# Patient Record
Sex: Female | Born: 1960 | Race: White | Hispanic: No | Marital: Married | Smoking: Never smoker
Health system: Southern US, Community
[De-identification: ages and names within clinical notes are randomized; demographics above are authoritative.]

---

## 2001-04-20 ENCOUNTER — Other Ambulatory Visit: Admission: RE | Admit: 2001-04-20 | Discharge: 2001-04-20 | Payer: Self-pay | Admitting: Gynecology

## 2001-09-06 ENCOUNTER — Encounter: Payer: Self-pay | Admitting: Gynecology

## 2001-09-06 ENCOUNTER — Encounter: Admission: RE | Admit: 2001-09-06 | Discharge: 2001-09-06 | Payer: Self-pay | Admitting: Gynecology

## 2002-04-28 ENCOUNTER — Other Ambulatory Visit: Admission: RE | Admit: 2002-04-28 | Discharge: 2002-04-28 | Payer: Self-pay | Admitting: Gynecology

## 2002-10-12 ENCOUNTER — Encounter: Payer: Self-pay | Admitting: Gynecology

## 2002-10-12 ENCOUNTER — Encounter: Admission: RE | Admit: 2002-10-12 | Discharge: 2002-10-12 | Payer: Self-pay | Admitting: Gynecology

## 2003-05-02 ENCOUNTER — Other Ambulatory Visit: Admission: RE | Admit: 2003-05-02 | Discharge: 2003-05-02 | Payer: Self-pay | Admitting: Gynecology

## 2004-01-26 ENCOUNTER — Encounter: Admission: RE | Admit: 2004-01-26 | Discharge: 2004-01-26 | Payer: Self-pay | Admitting: Gynecology

## 2004-09-17 ENCOUNTER — Other Ambulatory Visit: Admission: RE | Admit: 2004-09-17 | Discharge: 2004-09-17 | Payer: Self-pay | Admitting: Gynecology

## 2005-08-19 ENCOUNTER — Encounter: Admission: RE | Admit: 2005-08-19 | Discharge: 2005-08-19 | Payer: Self-pay | Admitting: Gynecology

## 2005-09-08 ENCOUNTER — Encounter: Admission: RE | Admit: 2005-09-08 | Discharge: 2005-09-08 | Payer: Self-pay | Admitting: Gynecology

## 2005-09-22 ENCOUNTER — Other Ambulatory Visit: Admission: RE | Admit: 2005-09-22 | Discharge: 2005-09-22 | Payer: Self-pay | Admitting: Gynecology

## 2006-09-11 ENCOUNTER — Encounter: Admission: RE | Admit: 2006-09-11 | Discharge: 2006-09-11 | Payer: Self-pay | Admitting: Gynecology

## 2006-09-23 ENCOUNTER — Other Ambulatory Visit: Admission: RE | Admit: 2006-09-23 | Discharge: 2006-09-23 | Payer: Self-pay | Admitting: Gynecology

## 2007-09-14 ENCOUNTER — Encounter: Admission: RE | Admit: 2007-09-14 | Discharge: 2007-09-14 | Payer: Self-pay | Admitting: Gynecology

## 2008-09-15 ENCOUNTER — Encounter: Admission: RE | Admit: 2008-09-15 | Discharge: 2008-09-15 | Payer: Self-pay | Admitting: Gynecology

## 2009-09-28 ENCOUNTER — Encounter: Admission: RE | Admit: 2009-09-28 | Discharge: 2009-09-28 | Payer: Self-pay | Admitting: Gynecology

## 2010-09-15 ENCOUNTER — Encounter: Payer: Self-pay | Admitting: Gynecology

## 2010-09-19 ENCOUNTER — Other Ambulatory Visit: Payer: Self-pay | Admitting: Gynecology

## 2010-09-19 DIAGNOSIS — Z1231 Encounter for screening mammogram for malignant neoplasm of breast: Secondary | ICD-10-CM

## 2010-09-19 DIAGNOSIS — Z1239 Encounter for other screening for malignant neoplasm of breast: Secondary | ICD-10-CM

## 2010-10-04 ENCOUNTER — Ambulatory Visit
Admission: RE | Admit: 2010-10-04 | Discharge: 2010-10-04 | Disposition: A | Discharge status: Home / Self Care | Payer: BC Managed Care – PPO | Source: Ambulatory Visit | Attending: Gynecology | Admitting: Gynecology

## 2010-10-04 DIAGNOSIS — Z1231 Encounter for screening mammogram for malignant neoplasm of breast: Secondary | ICD-10-CM

## 2011-09-18 ENCOUNTER — Other Ambulatory Visit: Payer: Self-pay | Admitting: Gynecology

## 2011-09-18 DIAGNOSIS — Z1231 Encounter for screening mammogram for malignant neoplasm of breast: Secondary | ICD-10-CM

## 2011-10-07 ENCOUNTER — Ambulatory Visit
Admission: RE | Admit: 2011-10-07 | Discharge: 2011-10-07 | Disposition: A | Discharge status: Home / Self Care | Payer: BC Managed Care – PPO | Source: Ambulatory Visit | Attending: Gynecology | Admitting: Gynecology

## 2011-10-07 DIAGNOSIS — Z1231 Encounter for screening mammogram for malignant neoplasm of breast: Secondary | ICD-10-CM

## 2012-09-22 ENCOUNTER — Other Ambulatory Visit: Payer: Self-pay | Admitting: Gynecology

## 2012-09-22 DIAGNOSIS — Z1231 Encounter for screening mammogram for malignant neoplasm of breast: Secondary | ICD-10-CM

## 2012-10-14 ENCOUNTER — Ambulatory Visit (INDEPENDENT_AMBULATORY_CARE_PROVIDER_SITE_OTHER): Payer: BC Managed Care – PPO

## 2012-10-14 DIAGNOSIS — Z1231 Encounter for screening mammogram for malignant neoplasm of breast: Secondary | ICD-10-CM

## 2012-10-18 ENCOUNTER — Other Ambulatory Visit: Payer: Self-pay | Admitting: Gynecology

## 2012-10-18 DIAGNOSIS — R928 Other abnormal and inconclusive findings on diagnostic imaging of breast: Secondary | ICD-10-CM

## 2012-10-26 ENCOUNTER — Ambulatory Visit
Admission: RE | Admit: 2012-10-26 | Discharge: 2012-10-26 | Disposition: A | Discharge status: Home / Self Care | Payer: BC Managed Care – PPO | Source: Ambulatory Visit | Attending: Gynecology | Admitting: Gynecology

## 2012-10-26 DIAGNOSIS — R928 Other abnormal and inconclusive findings on diagnostic imaging of breast: Secondary | ICD-10-CM

## 2012-10-27 ENCOUNTER — Other Ambulatory Visit: Payer: BC Managed Care – PPO

## 2013-10-18 ENCOUNTER — Other Ambulatory Visit: Payer: Self-pay | Admitting: Gynecology

## 2013-10-18 DIAGNOSIS — Z1231 Encounter for screening mammogram for malignant neoplasm of breast: Secondary | ICD-10-CM

## 2013-10-20 ENCOUNTER — Ambulatory Visit: Payer: BC Managed Care – PPO

## 2013-10-25 ENCOUNTER — Ambulatory Visit (INDEPENDENT_AMBULATORY_CARE_PROVIDER_SITE_OTHER): Payer: BC Managed Care – PPO

## 2013-10-25 DIAGNOSIS — Z1231 Encounter for screening mammogram for malignant neoplasm of breast: Secondary | ICD-10-CM

## 2014-08-01 DIAGNOSIS — J302 Other seasonal allergic rhinitis: Secondary | ICD-10-CM | POA: Insufficient documentation

## 2014-10-03 ENCOUNTER — Other Ambulatory Visit: Payer: Self-pay | Admitting: Gynecology

## 2014-10-03 DIAGNOSIS — Z1231 Encounter for screening mammogram for malignant neoplasm of breast: Secondary | ICD-10-CM

## 2014-11-01 ENCOUNTER — Ambulatory Visit (INDEPENDENT_AMBULATORY_CARE_PROVIDER_SITE_OTHER): Payer: BLUE CROSS/BLUE SHIELD

## 2014-11-01 ENCOUNTER — Ambulatory Visit: Payer: Self-pay

## 2014-11-01 DIAGNOSIS — Z1231 Encounter for screening mammogram for malignant neoplasm of breast: Secondary | ICD-10-CM

## 2015-10-08 ENCOUNTER — Other Ambulatory Visit: Payer: Self-pay | Admitting: Gynecology

## 2015-10-08 DIAGNOSIS — Z1231 Encounter for screening mammogram for malignant neoplasm of breast: Secondary | ICD-10-CM

## 2015-11-07 ENCOUNTER — Ambulatory Visit (INDEPENDENT_AMBULATORY_CARE_PROVIDER_SITE_OTHER): Payer: BLUE CROSS/BLUE SHIELD

## 2015-11-07 DIAGNOSIS — Z1231 Encounter for screening mammogram for malignant neoplasm of breast: Secondary | ICD-10-CM | POA: Diagnosis not present

## 2015-11-08 DIAGNOSIS — F5102 Adjustment insomnia: Secondary | ICD-10-CM | POA: Insufficient documentation

## 2016-06-13 DIAGNOSIS — E349 Endocrine disorder, unspecified: Secondary | ICD-10-CM | POA: Insufficient documentation

## 2016-10-29 ENCOUNTER — Other Ambulatory Visit: Payer: Self-pay | Admitting: Gynecology

## 2016-10-29 DIAGNOSIS — Z1231 Encounter for screening mammogram for malignant neoplasm of breast: Secondary | ICD-10-CM

## 2016-11-11 DIAGNOSIS — L609 Nail disorder, unspecified: Secondary | ICD-10-CM | POA: Insufficient documentation

## 2016-11-11 DIAGNOSIS — Z0000 Encounter for general adult medical examination without abnormal findings: Secondary | ICD-10-CM | POA: Insufficient documentation

## 2016-11-11 DIAGNOSIS — H01001 Unspecified blepharitis right upper eyelid: Secondary | ICD-10-CM | POA: Insufficient documentation

## 2016-11-12 DIAGNOSIS — H6121 Impacted cerumen, right ear: Secondary | ICD-10-CM | POA: Insufficient documentation

## 2016-11-19 ENCOUNTER — Ambulatory Visit: Payer: BLUE CROSS/BLUE SHIELD

## 2016-11-28 ENCOUNTER — Ambulatory Visit: Payer: BLUE CROSS/BLUE SHIELD

## 2016-12-26 ENCOUNTER — Ambulatory Visit (INDEPENDENT_AMBULATORY_CARE_PROVIDER_SITE_OTHER): Payer: BLUE CROSS/BLUE SHIELD

## 2016-12-26 DIAGNOSIS — Z1231 Encounter for screening mammogram for malignant neoplasm of breast: Secondary | ICD-10-CM

## 2017-02-02 DIAGNOSIS — H7291 Unspecified perforation of tympanic membrane, right ear: Secondary | ICD-10-CM | POA: Insufficient documentation

## 2017-02-23 DIAGNOSIS — K760 Fatty (change of) liver, not elsewhere classified: Secondary | ICD-10-CM | POA: Insufficient documentation

## 2017-02-23 DIAGNOSIS — K130 Diseases of lips: Secondary | ICD-10-CM | POA: Insufficient documentation

## 2017-12-23 ENCOUNTER — Other Ambulatory Visit: Payer: Self-pay | Admitting: Family Medicine

## 2017-12-23 DIAGNOSIS — Z1231 Encounter for screening mammogram for malignant neoplasm of breast: Secondary | ICD-10-CM

## 2017-12-30 ENCOUNTER — Ambulatory Visit (INDEPENDENT_AMBULATORY_CARE_PROVIDER_SITE_OTHER): Payer: BLUE CROSS/BLUE SHIELD

## 2017-12-30 DIAGNOSIS — Z1231 Encounter for screening mammogram for malignant neoplasm of breast: Secondary | ICD-10-CM | POA: Diagnosis not present

## 2018-01-21 DIAGNOSIS — M8580 Other specified disorders of bone density and structure, unspecified site: Secondary | ICD-10-CM | POA: Insufficient documentation

## 2018-01-21 DIAGNOSIS — N951 Menopausal and female climacteric states: Secondary | ICD-10-CM | POA: Insufficient documentation

## 2018-01-21 DIAGNOSIS — R87619 Unspecified abnormal cytological findings in specimens from cervix uteri: Secondary | ICD-10-CM | POA: Insufficient documentation

## 2018-03-09 DIAGNOSIS — R748 Abnormal levels of other serum enzymes: Secondary | ICD-10-CM | POA: Insufficient documentation

## 2018-06-03 ENCOUNTER — Ambulatory Visit: Payer: BLUE CROSS/BLUE SHIELD | Admitting: Podiatry

## 2018-06-03 ENCOUNTER — Other Ambulatory Visit: Payer: Self-pay | Admitting: Podiatry

## 2018-06-03 ENCOUNTER — Ambulatory Visit (INDEPENDENT_AMBULATORY_CARE_PROVIDER_SITE_OTHER): Payer: BLUE CROSS/BLUE SHIELD

## 2018-06-03 ENCOUNTER — Encounter: Payer: Self-pay | Admitting: Podiatry

## 2018-06-03 VITALS — BP 103/60 | HR 70

## 2018-06-03 DIAGNOSIS — M21619 Bunion of unspecified foot: Secondary | ICD-10-CM

## 2018-06-03 DIAGNOSIS — M21612 Bunion of left foot: Secondary | ICD-10-CM | POA: Diagnosis not present

## 2018-06-03 DIAGNOSIS — M21611 Bunion of right foot: Secondary | ICD-10-CM

## 2018-06-03 NOTE — Patient Instructions (Signed)

## 2018-06-03 NOTE — Progress Notes (Signed)
Subjective:   Patient ID: Virginia Rollins, female   DOB: 57 y.o.   MRN: [id number]   HPI Patient presents stating I have had problems with bunions for years with my mother and my brother having had there is fixed.  Patient states the right is bothering her slightly more in the left throat bother her she wears wider shoes and try soaks and is worried because she will be wearing tighter shoes in the wintertime   Review of Systems  All other systems reviewed and are negative.       Objective:  Physical Exam  Constitutional: She appears well-developed and well-nourished.  Cardiovascular: Intact distal pulses.  Pulmonary/Chest: Effort normal.  Musculoskeletal: Normal range of motion.  Neurological: She is alert.  Skin: Skin is warm.  Nursing note and vitals reviewed.   Neurovascular status intact muscle strength is adequate range of motion within normal limits with patient found to have hyperostosis medial aspect first metatarsal head bilateral with redness around the joint surfaces and mild to moderate pain with palpation.  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  I do think this will require surgical intervention and I discussed with her osteotomy of the distal nature and explained procedure and risk.  Patient wants surgery understanding risk and is getting get information concerning this and schedule with Alma Friendly and I will see her prior to procedure to discuss the procedure in greater detail.  All information on distal osteotomy given to patient today  X-rays indicate there is elevation of the intermetatarsal angle bilateral a moderate nature with moderate prominence around the first metatarsal head

## 2018-09-06 ENCOUNTER — Telehealth: Payer: Self-pay | Admitting: *Deleted

## 2018-09-06 NOTE — Telephone Encounter (Signed)
"  I need to schedule my surgery with Dr. Paulla Dolly.  I saw him back in September, I believe and he told me I needed surgery on both my feet.  I wanted to think about it and check my insurance.  I think I only want to have one foot done at this time." You need to schedule a consult appointment with Dr. Paulla Dolly.  I can schedule you for a tentative date, then transfer you to a scheduler so you can make an appointment for a consultation.  Do you have a date in mind?  "I can do it late January or February."  Dr. Paulla Dolly can do it February 11 or 18.  "Let's do it on February 18 because I have something to do on February 11."  I'll put you down as tenative until you come in for your consult.  I transferred her to Ria Comment, so she could schedule her appointment with Dr. Paulla Dolly.

## 2018-09-16 ENCOUNTER — Ambulatory Visit (INDEPENDENT_AMBULATORY_CARE_PROVIDER_SITE_OTHER): Payer: BLUE CROSS/BLUE SHIELD

## 2018-09-16 ENCOUNTER — Ambulatory Visit: Payer: BLUE CROSS/BLUE SHIELD | Admitting: Podiatry

## 2018-09-16 ENCOUNTER — Other Ambulatory Visit: Payer: Self-pay | Admitting: Podiatry

## 2018-09-16 ENCOUNTER — Encounter: Payer: Self-pay | Admitting: Podiatry

## 2018-09-16 DIAGNOSIS — M2012 Hallux valgus (acquired), left foot: Principal | ICD-10-CM

## 2018-09-16 DIAGNOSIS — M2011 Hallux valgus (acquired), right foot: Secondary | ICD-10-CM

## 2018-09-16 DIAGNOSIS — M79671 Pain in right foot: Secondary | ICD-10-CM

## 2018-09-16 DIAGNOSIS — M21619 Bunion of unspecified foot: Secondary | ICD-10-CM

## 2018-09-16 DIAGNOSIS — M79672 Pain in left foot: Secondary | ICD-10-CM

## 2018-09-16 NOTE — Patient Instructions (Signed)
Pre-Operative Instructions  Congratulations, you have decided to take an important step towards improving your quality of life.  You can be assured that the doctors and staff at [facility] will be with you every step of the way.  Here are some important things you should know:  1. Plan to be at the surgery center/hospital at least 1 (one) hour prior to your scheduled time, unless otherwise directed by the surgical center/hospital staff.  You must have a responsible adult accompany you, remain during the surgery and drive you home.  Make sure you have directions to the surgical center/hospital to ensure you arrive on time. 2. If you are having surgery at [facility] or [facility], you will need a copy of your medical history and physical form from your family physician within one month prior to the date of surgery. We will give you a form for your primary physician to complete.  3. We make every effort to accommodate the date you request for surgery.  However, there are times where surgery dates or times have to be moved.  We will contact you as soon as possible if a change in schedule is required.   4. No aspirin/ibuprofen for one week before surgery.  If you are on aspirin, any non-steroidal anti-inflammatory medications (Mobic, Aleve, Ibuprofen) should not be taken seven (7) days prior to your surgery.  You make take Tylenol for pain prior to surgery.  5. Medications - If you are taking daily heart and blood pressure medications, seizure, reflux, allergy, asthma, anxiety, pain or diabetes medications, make sure you notify the surgery center/hospital before the day of surgery so they can tell you which medications you should take or avoid the day of surgery. 6. No food or drink after midnight the night before surgery unless directed otherwise by surgical center/hospital staff. 7. No alcoholic beverages 24-hours prior to surgery.  No smoking 24-hours prior or 24-hours after  surgery. 8. Wear loose pants or shorts. They should be loose enough to fit over bandages, boots, and casts. 9. Don't wear slip-on shoes. Sneakers are preferred. 10. Bring your boot with you to the surgery center/hospital.  Also bring crutches or a walker if your physician has prescribed it for you.  If you do not have this equipment, it will be provided for you after surgery. 11. If you have not been contacted by the surgery center/hospital by the day before your surgery, call to confirm the date and time of your surgery. 12. Leave-time from work may vary depending on the type of surgery you have.  Appropriate arrangements should be made prior to surgery with your employer. 13. Prescriptions will be provided immediately following surgery by your doctor.  Fill these as soon as possible after surgery and take the medication as directed. Pain medications will not be refilled on weekends and must be approved by the doctor. 14. Remove nail polish on the operative foot and avoid getting pedicures prior to surgery. 15. Wash the night before surgery.  The night before surgery wash the foot and leg well with water and the antibacterial soap provided. Be sure to pay special attention to beneath the toenails and in between the toes.  Wash for at least three (3) minutes. Rinse thoroughly with water and dry well with a towel.  Perform this wash unless told not to do so by your physician.  Enclosed: 1 Ice pack (please put in freezer the night before surgery)   1 Hibiclens skin cleaner     Pre-op instructions  If you have any questions regarding the instructions, please do not hesitate to call our office.  [place]: [address], [place], [place] [address] -- [phone]  [place]: [address] [place], [place] [address] -- [phone]  [place]: [address]  [place], [place] [address] -- [phone]  [place]: [address], [address], [place], [place] [address] -- [phone]  Website:  [email or web address] 

## 2018-10-12 ENCOUNTER — Encounter: Payer: Self-pay | Admitting: Podiatry

## 2018-10-12 DIAGNOSIS — M2011 Hallux valgus (acquired), right foot: Secondary | ICD-10-CM

## 2018-10-20 ENCOUNTER — Ambulatory Visit (INDEPENDENT_AMBULATORY_CARE_PROVIDER_SITE_OTHER): Payer: BLUE CROSS/BLUE SHIELD | Admitting: Podiatry

## 2018-10-20 ENCOUNTER — Encounter: Payer: Self-pay | Admitting: Podiatry

## 2018-10-20 ENCOUNTER — Ambulatory Visit (INDEPENDENT_AMBULATORY_CARE_PROVIDER_SITE_OTHER): Payer: BLUE CROSS/BLUE SHIELD

## 2018-10-20 VITALS — Temp 96.8°F

## 2018-10-20 DIAGNOSIS — Z09 Encounter for follow-up examination after completed treatment for conditions other than malignant neoplasm: Secondary | ICD-10-CM

## 2018-10-20 DIAGNOSIS — M21611 Bunion of right foot: Secondary | ICD-10-CM

## 2018-10-20 DIAGNOSIS — M21619 Bunion of unspecified foot: Secondary | ICD-10-CM

## 2018-10-21 NOTE — Progress Notes (Signed)
Subjective:   Patient ID: Virginia Rollins, female   DOB: 58 y.o.   MRN: [id number]   HPI Patient states my right foot is doing excellent and I am ready to get the left one fixed but need to wait till after Easter.  Patient states she is very pleased with how things are going   ROS      Objective:  Physical Exam  Neurovascular status intact negative Homans sign noted with patient's right first metatarsal healing well wound edges well coapted good range of motion     Assessment:  Well post Liane Comber osteotomy right of 8 days duration with good alignment wound edges well coapted hallux in rectus position     Plan:  H&P reviewed condition and recommended continuation of immobilization elevation compression and reapplied sterile dressing.  Reappoint in 2 to 3 weeks or earlier if needed and explained range of motion exercises  X-ray indicates osteotomies healing well good alignment noted with joint congruence

## 2018-11-03 ENCOUNTER — Other Ambulatory Visit: Payer: Self-pay

## 2018-11-03 ENCOUNTER — Ambulatory Visit (INDEPENDENT_AMBULATORY_CARE_PROVIDER_SITE_OTHER): Payer: BLUE CROSS/BLUE SHIELD | Admitting: Podiatry

## 2018-11-03 ENCOUNTER — Ambulatory Visit (INDEPENDENT_AMBULATORY_CARE_PROVIDER_SITE_OTHER): Payer: BLUE CROSS/BLUE SHIELD

## 2018-11-03 ENCOUNTER — Encounter: Payer: Self-pay | Admitting: Podiatry

## 2018-11-03 DIAGNOSIS — M21611 Bunion of right foot: Secondary | ICD-10-CM

## 2018-11-03 DIAGNOSIS — Z09 Encounter for follow-up examination after completed treatment for conditions other than malignant neoplasm: Secondary | ICD-10-CM

## 2018-11-03 DIAGNOSIS — M21619 Bunion of unspecified foot: Secondary | ICD-10-CM

## 2018-11-03 DIAGNOSIS — M21612 Bunion of left foot: Secondary | ICD-10-CM

## 2018-11-03 NOTE — Patient Instructions (Signed)
Pre-Operative Instructions  Congratulations, you have decided to take an important step towards improving your quality of life.  You can be assured that the doctors and staff at [facility] will be with you every step of the way.  Here are some important things you should know:  1. Plan to be at the surgery center/hospital at least 1 (one) hour prior to your scheduled time, unless otherwise directed by the surgical center/hospital staff.  You must have a responsible adult accompany you, remain during the surgery and drive you home.  Make sure you have directions to the surgical center/hospital to ensure you arrive on time. 2. If you are having surgery at [facility] or [facility], you will need a copy of your medical history and physical form from your family physician within one month prior to the date of surgery. We will give you a form for your primary physician to complete.  3. We make every effort to accommodate the date you request for surgery.  However, there are times where surgery dates or times have to be moved.  We will contact you as soon as possible if a change in schedule is required.   4. No aspirin/ibuprofen for one week before surgery.  If you are on aspirin, any non-steroidal anti-inflammatory medications (Mobic, Aleve, Ibuprofen) should not be taken seven (7) days prior to your surgery.  You make take Tylenol for pain prior to surgery.  5. Medications - If you are taking daily heart and blood pressure medications, seizure, reflux, allergy, asthma, anxiety, pain or diabetes medications, make sure you notify the surgery center/hospital before the day of surgery so they can tell you which medications you should take or avoid the day of surgery. 6. No food or drink after midnight the night before surgery unless directed otherwise by surgical center/hospital staff. 7. No alcoholic beverages 24-hours prior to surgery.  No smoking 24-hours prior or 24-hours after  surgery. 8. Wear loose pants or shorts. They should be loose enough to fit over bandages, boots, and casts. 9. Don't wear slip-on shoes. Sneakers are preferred. 10. Bring your boot with you to the surgery center/hospital.  Also bring crutches or a walker if your physician has prescribed it for you.  If you do not have this equipment, it will be provided for you after surgery. 11. If you have not been contacted by the surgery center/hospital by the day before your surgery, call to confirm the date and time of your surgery. 12. Leave-time from work may vary depending on the type of surgery you have.  Appropriate arrangements should be made prior to surgery with your employer. 13. Prescriptions will be provided immediately following surgery by your doctor.  Fill these as soon as possible after surgery and take the medication as directed. Pain medications will not be refilled on weekends and must be approved by the doctor. 14. Remove nail polish on the operative foot and avoid getting pedicures prior to surgery. 15. Wash the night before surgery.  The night before surgery wash the foot and leg well with water and the antibacterial soap provided. Be sure to pay special attention to beneath the toenails and in between the toes.  Wash for at least three (3) minutes. Rinse thoroughly with water and dry well with a towel.  Perform this wash unless told not to do so by your physician.  Enclosed: 1 Ice pack (please put in freezer the night before surgery)   1 Hibiclens skin cleaner     Pre-op instructions  If you have any questions regarding the instructions, please do not hesitate to call our office.  [place]: [address], [place], [place] [address] -- [phone]  [place]: [address] [place], [place] [address] -- [phone]  [place]: [address]  , [place] [address] -- [phone]  [place]: [address], [place],  [address] -- [phone]  Website:  [email or web address] 

## 2018-11-03 NOTE — Progress Notes (Signed)
Subjective:   Patient ID: Virginia Rollins, female   DOB: 58 y.o.   MRN: [id number]   HPI Patient presents stating my right foot is coming along beautifully and I am ready to get my left foot fixed in the next few weeks   ROS      Objective:  Physical Exam  Neurovascular status intact with patient's right foot healing well with wound edges well coapted hallux in rectus position good range of motion with the left showing significant structural bunion with redness pain around the first metatarsal     Assessment:  Doing well post Liane Comber type osteotomy right with bunion deformity left     Plan:  H&P reviewed x-ray of right and discussed surgical intervention the left and allowed her to read consent form going over alternative treatments complications and the fact that just because one point did so well no guarantee the other one will and she understands all of this and signed consent form.  At this point for the right I recommended continued range of motion exercises gradual increase in activity and return to soft shoe gear and she will be seen back again in approximately 4 weeks or earlier if needed  X-ray indicates osteotomies healing well fixation in place joint congruence and in alignment

## 2018-11-22 ENCOUNTER — Telehealth: Payer: Self-pay | Admitting: *Deleted

## 2018-11-22 NOTE — Telephone Encounter (Signed)
"  I assume the surgeries are still canceled.  I had an upcoming surgery scheduled for April 14."  Yes, we need to reschedule your surgery.  "I'll call back because I need to see when I will have a 10 day period where I can take off.  It'll probably be towards the end of July.  I'll call you back later to reschedule it.  I have to check my calendar and with my job."  I'll cancel your surgery.  Call when you're ready to reschedule it.  I canceled the surgery via the surgical center's [facility] [employer] Portal.

## 2018-12-09 ENCOUNTER — Telehealth: Payer: Self-pay | Admitting: *Deleted

## 2018-12-09 NOTE — Telephone Encounter (Signed)
Pt states she had surgery 10/12/2018, and was continuing to have swelling. I told pt it is typical to have swelling up to 6-9 months after surgery to varying degrees and periods of time, to continue with the compression sock which is to help train the surgery swelling to stay out of the foot as she finds periods of time when the compression sock feels loose she may experiment to see if she could go without it, and periods of time that swell may go back in to the compression sock. I told pt to rest, ice as able during periods of swelling. Pt states her foot also remains stiff. I told pt she should make a follow up appt 4 weeks after the last office visit to check foot status and possible PT, which I did see in Dr. Mellody Drown 11/03/2018 note. Pt agreed and I transferred to scheduler.

## 2018-12-20 ENCOUNTER — Encounter: Payer: BLUE CROSS/BLUE SHIELD | Admitting: Podiatry

## 2019-01-05 ENCOUNTER — Ambulatory Visit (INDEPENDENT_AMBULATORY_CARE_PROVIDER_SITE_OTHER): Payer: BLUE CROSS/BLUE SHIELD

## 2019-01-05 ENCOUNTER — Other Ambulatory Visit: Payer: Self-pay

## 2019-01-05 ENCOUNTER — Encounter: Payer: Self-pay | Admitting: Podiatry

## 2019-01-05 ENCOUNTER — Ambulatory Visit (INDEPENDENT_AMBULATORY_CARE_PROVIDER_SITE_OTHER): Payer: BLUE CROSS/BLUE SHIELD | Admitting: Podiatry

## 2019-01-05 VITALS — Temp 98.2°F

## 2019-01-05 DIAGNOSIS — Z09 Encounter for follow-up examination after completed treatment for conditions other than malignant neoplasm: Secondary | ICD-10-CM

## 2019-01-05 DIAGNOSIS — M21619 Bunion of unspecified foot: Secondary | ICD-10-CM

## 2019-01-05 DIAGNOSIS — M779 Enthesopathy, unspecified: Secondary | ICD-10-CM | POA: Diagnosis not present

## 2019-01-05 DIAGNOSIS — M21611 Bunion of right foot: Secondary | ICD-10-CM | POA: Diagnosis not present

## 2019-01-05 DIAGNOSIS — D649 Anemia, unspecified: Secondary | ICD-10-CM | POA: Insufficient documentation

## 2019-01-05 NOTE — Progress Notes (Signed)
Subjective:   Patient ID: Virginia Rollins, female   DOB: 58 y.o.   MRN: [id number]   HPI Patient states that is getting some pain under my right foot and I know I need to get my left foot done but I cannot do till later in the year   ROS      Objective:  Physical Exam  Neurovascular status intact with patient's right first MPJ healing well with wound edges well coapted with patient found to have mild discomfort around the second MPJ right and around the fifth metatarsal head right with structural bunion deformity left     Assessment:  Probability for inflammatory capsulitis subsecond right secondary to increased blood flow to the area with well-healing surgical site of the right first MPJ with structural bunion left     Plan:  H&P discussed all conditions and for the capsulitis I recommended impression elevation and padding as needed along with rigid bottom shoes and it should resolve over time advised on continued range of motion exercises and discussed again surgical correction of left foot  X-ray right indicates osteotomies healing well joint congruence fixation in place with no movement and good healing of the underlying bone

## 2019-02-07 ENCOUNTER — Other Ambulatory Visit: Payer: Self-pay | Admitting: Family Medicine

## 2019-02-07 DIAGNOSIS — Z1231 Encounter for screening mammogram for malignant neoplasm of breast: Secondary | ICD-10-CM

## 2019-02-15 DIAGNOSIS — D229 Melanocytic nevi, unspecified: Secondary | ICD-10-CM | POA: Insufficient documentation

## 2019-02-17 ENCOUNTER — Other Ambulatory Visit: Payer: Self-pay

## 2019-02-17 ENCOUNTER — Ambulatory Visit (INDEPENDENT_AMBULATORY_CARE_PROVIDER_SITE_OTHER): Payer: BLUE CROSS/BLUE SHIELD

## 2019-02-17 DIAGNOSIS — Z1231 Encounter for screening mammogram for malignant neoplasm of breast: Secondary | ICD-10-CM | POA: Diagnosis not present

## 2019-05-16 ENCOUNTER — Telehealth: Payer: Self-pay | Admitting: *Deleted

## 2019-05-16 NOTE — Telephone Encounter (Signed)
Pt called states she had surgery in February 2020 and is still having pain with periods of sharp pain.

## 2019-05-16 NOTE — Telephone Encounter (Signed)
I spoke with pt and she states she has pain with periods of sharp pain through the foot through the bottom. I told pt that for her to be having sharp pain this far in to her recovery she should be seen by Dr. Paulla Dolly. Message sent to schedulers to call pt for an appt.

## 2019-05-16 NOTE — Telephone Encounter (Signed)
Scheduled for 1:15 pm on Monday 09/28.

## 2019-05-23 ENCOUNTER — Ambulatory Visit (INDEPENDENT_AMBULATORY_CARE_PROVIDER_SITE_OTHER): Payer: BLUE CROSS/BLUE SHIELD

## 2019-05-23 ENCOUNTER — Encounter: Payer: Self-pay | Admitting: Podiatry

## 2019-05-23 ENCOUNTER — Ambulatory Visit: Payer: BLUE CROSS/BLUE SHIELD | Admitting: Podiatry

## 2019-05-23 ENCOUNTER — Other Ambulatory Visit: Payer: Self-pay

## 2019-05-23 ENCOUNTER — Ambulatory Visit (INDEPENDENT_AMBULATORY_CARE_PROVIDER_SITE_OTHER): Payer: BLUE CROSS/BLUE SHIELD | Admitting: Podiatry

## 2019-05-23 DIAGNOSIS — M7751 Other enthesopathy of right foot: Secondary | ICD-10-CM

## 2019-05-23 DIAGNOSIS — M2011 Hallux valgus (acquired), right foot: Secondary | ICD-10-CM

## 2019-05-23 DIAGNOSIS — M779 Enthesopathy, unspecified: Secondary | ICD-10-CM

## 2019-05-23 NOTE — Progress Notes (Signed)
Subjective:   Patient ID: Virginia Rollins, female   DOB: 58 y.o.   MRN: [id number]   HPI Patient presents approximate 8 months after having bunion surgery right stating started developed some pain in the front part of the foot and wants to get the other foot fixed and discussed but first wants to make sure this foot is better   ROS      Objective:  Physical Exam  Neurovascular status intact with inflammatory changes not of the joint itself but the lateral portion of the first MPJ with motion found to be adequate with no restriction crepitus and structural bunion deformity left     Assessment:  Appears to be healed very well from bunion surgery right with possible inflammatory capsulitis along with structural bunion left     Plan:  H&P x-rays right reviewed and today I did a careful sterile prep and injected the lateral capsule of the joint 3 mg Dexasone Kenalog 5 mg Xylocaine advised on anti-inflammatories and for the left discussed bunion and patient wants correction but were not can do currently but will need to be done at one point in future  X-rays indicate the osteotomy is healed well fixation in place joint congruence

## 2019-06-08 ENCOUNTER — Encounter: Payer: Self-pay | Admitting: Podiatry

## 2019-06-08 ENCOUNTER — Ambulatory Visit: Payer: BLUE CROSS/BLUE SHIELD | Admitting: Podiatry

## 2019-06-08 ENCOUNTER — Other Ambulatory Visit: Payer: Self-pay

## 2019-06-08 DIAGNOSIS — M779 Enthesopathy, unspecified: Secondary | ICD-10-CM

## 2019-06-08 DIAGNOSIS — M79671 Pain in right foot: Secondary | ICD-10-CM | POA: Diagnosis not present

## 2019-06-08 MED ORDER — DICLOFENAC SODIUM 75 MG PO TBEC: 75.0000 mg | DELAYED_RELEASE_TABLET | Freq: Two times a day (BID) | ORAL | 2 refills | Status: AC

## 2019-06-08 NOTE — Progress Notes (Signed)
Subjective:   Patient ID: Virginia Rollins, female   DOB: 57 y.o.   MRN: [id number]   HPI Patient states still having a lot of pain in the right foot but states it is underneath the metatarsal head and is been about a month it is really been hurting her   ROS      Objective:  Physical Exam  Neurovascular status intact with inflammation pain of the plantar aspect of the right first metatarsal that is mild I did not note any excessive swelling or other pathology     Assessment:  May be some irritation around the metatarsal head but does not appear to be anything of significance from an injury perspective     Plan:  Reviewed condition and at this point we will get a try a metatarsal pad to diffuse pressure and I discussed possible pin removal or other treatments that could be attempted.  We will watch this and decide anything else but we will see the response to offloading the area

## 2019-06-29 ENCOUNTER — Ambulatory Visit: Payer: BLUE CROSS/BLUE SHIELD | Admitting: Podiatry

## 2019-06-29 ENCOUNTER — Encounter: Payer: Self-pay | Admitting: Podiatry

## 2019-06-29 ENCOUNTER — Other Ambulatory Visit: Payer: Self-pay

## 2019-06-29 DIAGNOSIS — Z472 Encounter for removal of internal fixation device: Secondary | ICD-10-CM

## 2019-06-29 DIAGNOSIS — Z09 Encounter for follow-up examination after completed treatment for conditions other than malignant neoplasm: Secondary | ICD-10-CM | POA: Diagnosis not present

## 2019-06-30 NOTE — Progress Notes (Signed)
Subjective:   Patient ID: Virginia Rollins, female   DOB: 58 y.o.   MRN: [id number]   HPI Patient presents stating still getting some discomfort on the plantar aspect of the right foot that is bothersome low-grade but present.  States the left bunion will need to be thick someday but she still is having some irritation with the right   ROS      Objective:  Physical Exam  Neurovascular status intact with dancers pad which have been helpful for the patient with mild to moderate inflammation only upon deep palpation     Assessment:  Possibility that pin irritation is creating her problem as the bone structure itself is healed well     Plan:  H&P reviewed condition I do think removal of the pins would be in her best interest.  I explained procedure risk and patient wants to have this done and I allowed her to read consent form going over alternative treatments complications.  She is went except risk of surgery signed consent form and is scheduled for outpatient pin removal x2 in the office.  She is encouraged to call with questions  X-ray indicates everything looks good as far as structural correction goes with fixation in place and joint congruence

## 2019-07-04 ENCOUNTER — Telehealth: Payer: Self-pay | Admitting: Podiatry

## 2019-07-04 NOTE — Telephone Encounter (Signed)
Calling to make sure I've been scheduled for the pin removal in the office on  09 December at 7:45 am. When I got to checkout they didn't put anything through the computer.

## 2019-07-04 NOTE — Telephone Encounter (Signed)
Called pt to let her know I have her scheduled for her office surgery for the pin removal at 7:45 am with Dr. Paulla Dolly on Wednesday 09 December. I told her I would also get her postop visits scheduled. Told her to call with any other questions/concerns.

## 2019-07-11 ENCOUNTER — Telehealth: Payer: Self-pay | Admitting: Podiatry

## 2019-07-11 NOTE — Telephone Encounter (Signed)
OFFICE SURGERY DATE OF SERVICE: 08/03/2019 SURGICAL PROCEDURE: Removal Fixation Deep Kwire/Screw; Removal 2 Pins from Right [id number])  Policy Effective : [id number]  -  08/25/2019  Member Liability Summary      In-Network   Max Per Benefit Period Year-to-Date Remaining     CoInsurance 20%         Deductible $3000.00 $0.00     Out-Of-Pocket 3 $8150.00 $4477.86    3 Out-of-Pocket includes copay, deductible, and coinsurance.  Physician Visit - Office: Well      In [place] Required $30.00  per  Visit Not Applicable   No Messages: Specialist

## 2019-08-03 ENCOUNTER — Encounter: Payer: Self-pay | Admitting: Podiatry

## 2019-08-03 ENCOUNTER — Ambulatory Visit: Payer: BLUE CROSS/BLUE SHIELD

## 2019-08-03 ENCOUNTER — Ambulatory Visit (INDEPENDENT_AMBULATORY_CARE_PROVIDER_SITE_OTHER): Payer: BLUE CROSS/BLUE SHIELD | Admitting: Podiatry

## 2019-08-03 ENCOUNTER — Other Ambulatory Visit: Payer: Self-pay

## 2019-08-03 VITALS — BP 126/79 | HR 65 | Temp 97.4°F | Resp 16

## 2019-08-03 DIAGNOSIS — M2011 Hallux valgus (acquired), right foot: Secondary | ICD-10-CM

## 2019-08-03 DIAGNOSIS — Z472 Encounter for removal of internal fixation device: Secondary | ICD-10-CM | POA: Diagnosis not present

## 2019-08-03 NOTE — Progress Notes (Signed)
Subjective:   Patient ID: Virginia Rollins, female   DOB: 58 y.o.   MRN: [id number]   HPI Patient presents for removal of 2 pins from the right foot and separate locations.  States they become irritated and she has some pain in the bottom of the foot and is hoping this will cure her problem   ROS      Objective:  Physical Exam  Neurovascular status intact with prominent pin position proximal first metatarsal shaft and distal pin in 2 separate locations     Assessment:  Chronic pin irritation right     Plan:  H&P and anesthesia was administered consisting of 60 mg like Marcaine mixture.  Patient was taken to the OR and patient was prepped and draped utilizing standard aseptic technique and the patient's right foot was exsanguinated with Ace wrap and the tourniquet inflated to 250 mmHg.  Following procedures were performed attention was directed to the proximal aspect of the first metatarsal shaft where a small 2 cm incision was made centered over the prominent pin position.  The incision was deepened through subcutaneous tissue with hemostasis being acquired down the capsule and capsular incision was performed and the pin was identified and removed in toto.  The wound was flushed copious vancomycin solution and sutured with 5-0 nylon.  Procedure #2 removal of distal pin right 10 directed to the dorsal aspect digit 1 right where a distal incision was made over the prominent pin.  The incision was deepened through subcutaneous tissue down to capsule and a linear capsular incision was performed and the capsular tissue was sharply dissected off the underlying bone.  I then went ahead and I remove the distal pin flush the wound and sutured with 5-0 nylon.  Sterile dressing applied to the right foot and the tourniquet was released and capillary fill noted to be immediate to all digits on the right foot and the patient tolerated well left the OR in satisfactory condition with instructions

## 2019-08-17 ENCOUNTER — Other Ambulatory Visit: Payer: Self-pay

## 2019-08-17 ENCOUNTER — Encounter: Payer: Self-pay | Admitting: Podiatry

## 2019-08-17 ENCOUNTER — Other Ambulatory Visit: Payer: Self-pay | Admitting: Podiatry

## 2019-08-17 ENCOUNTER — Ambulatory Visit (INDEPENDENT_AMBULATORY_CARE_PROVIDER_SITE_OTHER): Payer: BLUE CROSS/BLUE SHIELD

## 2019-08-17 ENCOUNTER — Ambulatory Visit (INDEPENDENT_AMBULATORY_CARE_PROVIDER_SITE_OTHER): Payer: BLUE CROSS/BLUE SHIELD | Admitting: Podiatry

## 2019-08-17 DIAGNOSIS — Z9889 Other specified postprocedural states: Secondary | ICD-10-CM

## 2019-08-17 DIAGNOSIS — Z472 Encounter for removal of internal fixation device: Secondary | ICD-10-CM | POA: Diagnosis not present

## 2019-08-17 NOTE — Progress Notes (Signed)
Subjective:   Patient ID: Virginia Rollins, female   DOB: 58 y.o.   MRN: [id number]   HPI Patient states doing really well with minimal discomfort noted in my foot   ROS      Objective:  Physical Exam  Neurovascular status intact wound edges well coapted stitches in place removal of 2 pins right     Assessment:  Doing well overall from pin removal with stitches intact     Plan:  Stitch removed wound edges coapted well apply sterile dressing reappoint to recheck  X-ray indicates excellent position with good alignment and no sequela from having pin treatment

## 2019-08-31 ENCOUNTER — Encounter: Payer: BLUE CROSS/BLUE SHIELD | Admitting: Podiatry

## 2020-01-09 ENCOUNTER — Other Ambulatory Visit: Payer: Self-pay | Admitting: Family Medicine

## 2020-01-09 DIAGNOSIS — Z1231 Encounter for screening mammogram for malignant neoplasm of breast: Secondary | ICD-10-CM

## 2020-02-23 ENCOUNTER — Ambulatory Visit (INDEPENDENT_AMBULATORY_CARE_PROVIDER_SITE_OTHER): Payer: BLUE CROSS/BLUE SHIELD

## 2020-02-23 ENCOUNTER — Other Ambulatory Visit: Payer: Self-pay

## 2020-02-23 DIAGNOSIS — Z1231 Encounter for screening mammogram for malignant neoplasm of breast: Secondary | ICD-10-CM

## 2021-02-27 ENCOUNTER — Other Ambulatory Visit: Payer: Self-pay | Admitting: Family Medicine

## 2021-02-27 DIAGNOSIS — Z1231 Encounter for screening mammogram for malignant neoplasm of breast: Secondary | ICD-10-CM

## 2021-03-07 ENCOUNTER — Other Ambulatory Visit: Payer: Self-pay

## 2021-03-07 ENCOUNTER — Ambulatory Visit (INDEPENDENT_AMBULATORY_CARE_PROVIDER_SITE_OTHER): Payer: BLUE CROSS/BLUE SHIELD

## 2021-03-07 DIAGNOSIS — Z1231 Encounter for screening mammogram for malignant neoplasm of breast: Secondary | ICD-10-CM

## 2022-09-21 IMAGING — MG MM DIGITAL SCREENING BILAT W/ TOMO AND CAD
8 series · 9 of 24 positions shown · non-contrast
Comparison: Previous exam(s).

CLINICAL DATA: Screening.

EXAM:
DIGITAL SCREENING BILATERAL MAMMOGRAM WITH TOMOSYNTHESIS AND CAD
TECHNIQUE: Bilateral screening digital craniocaudal and mediolateral oblique
mammograms were obtained. Bilateral screening digital breast
tomosynthesis was performed. The images were evaluated with
computer-aided detection.

[L CC synth-2D]
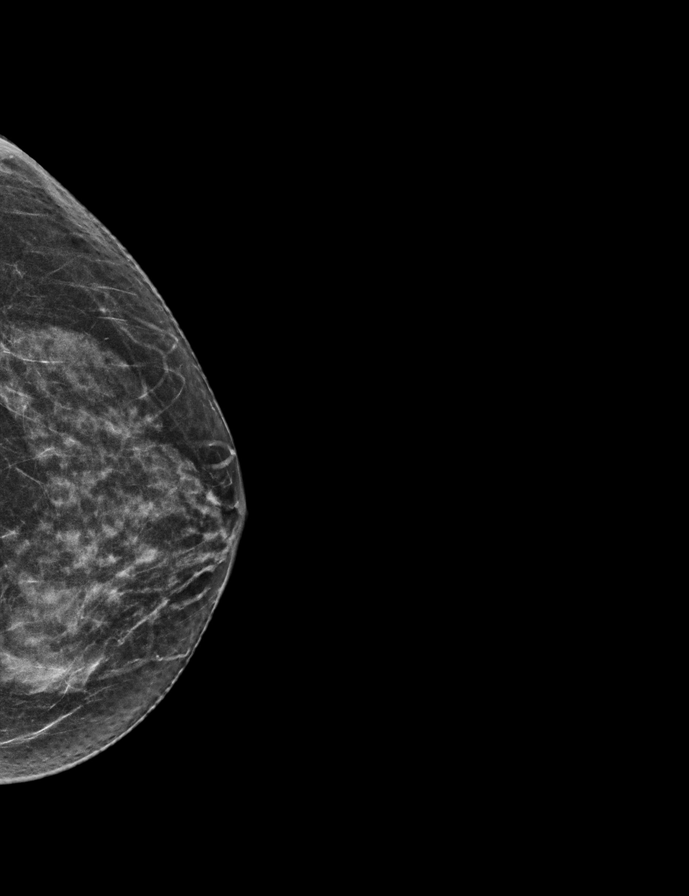

[L MLO synth-2D]
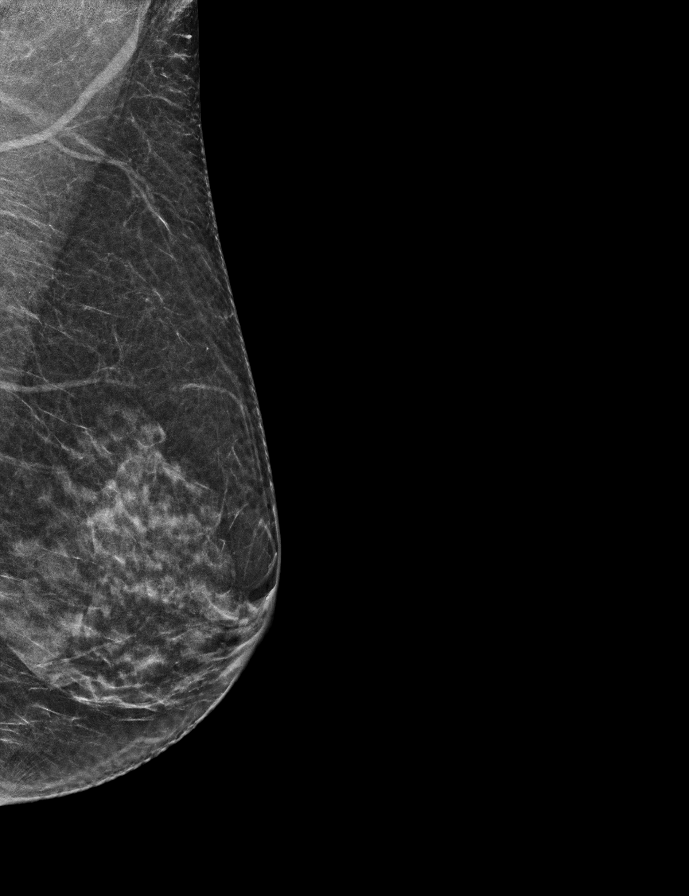

[R MLO synth-2D]
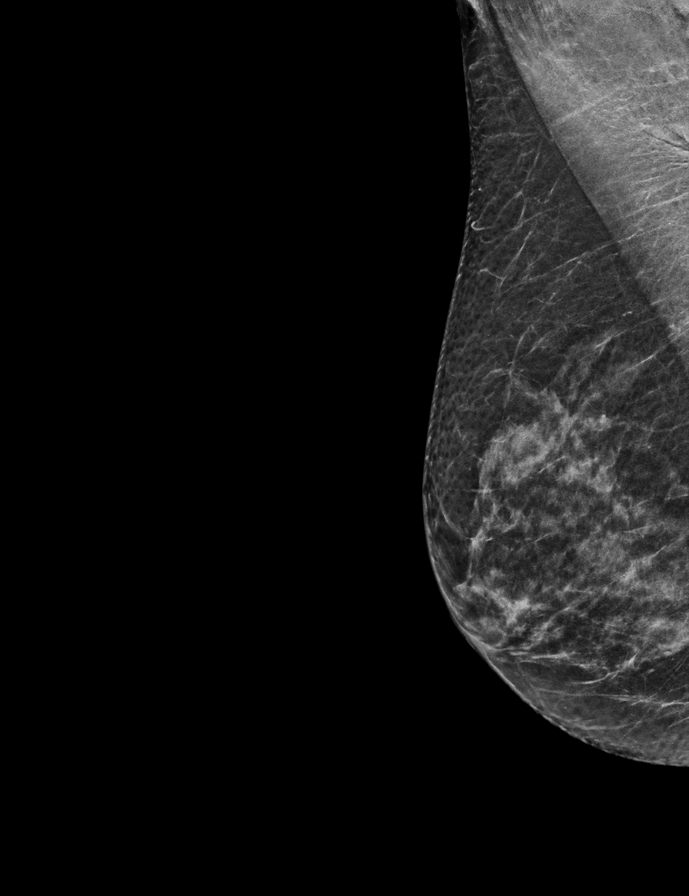

[R CC synth-2D]
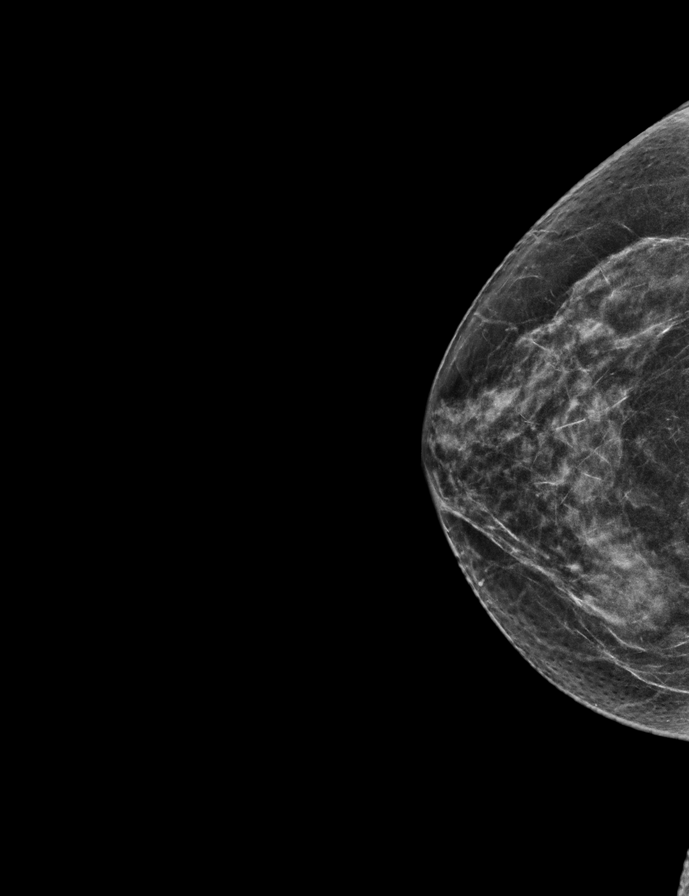

[R MLO tomo · 2 of 49 frames shown]
[frame 16/49]
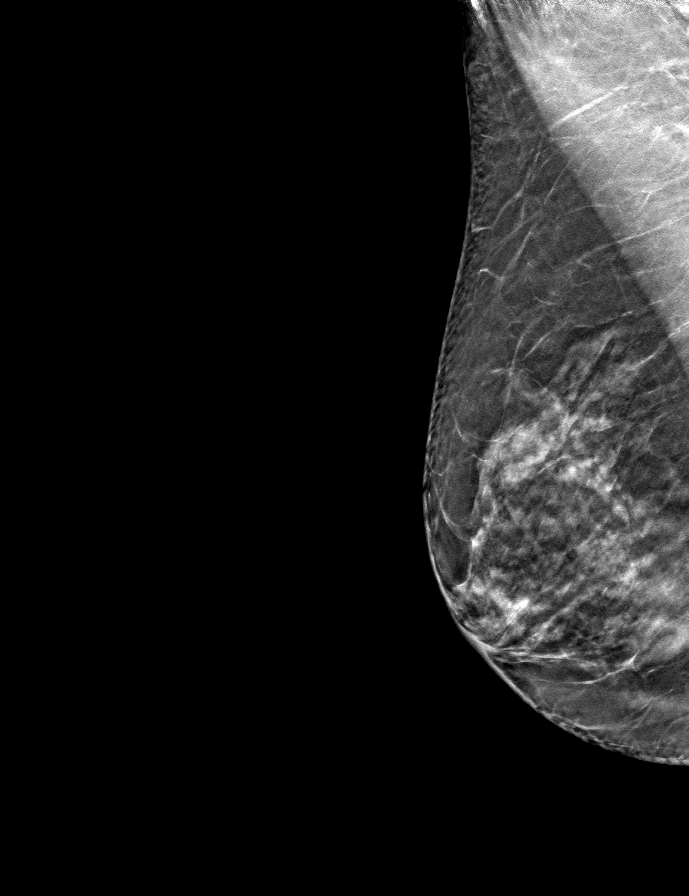
[frame 25/49]
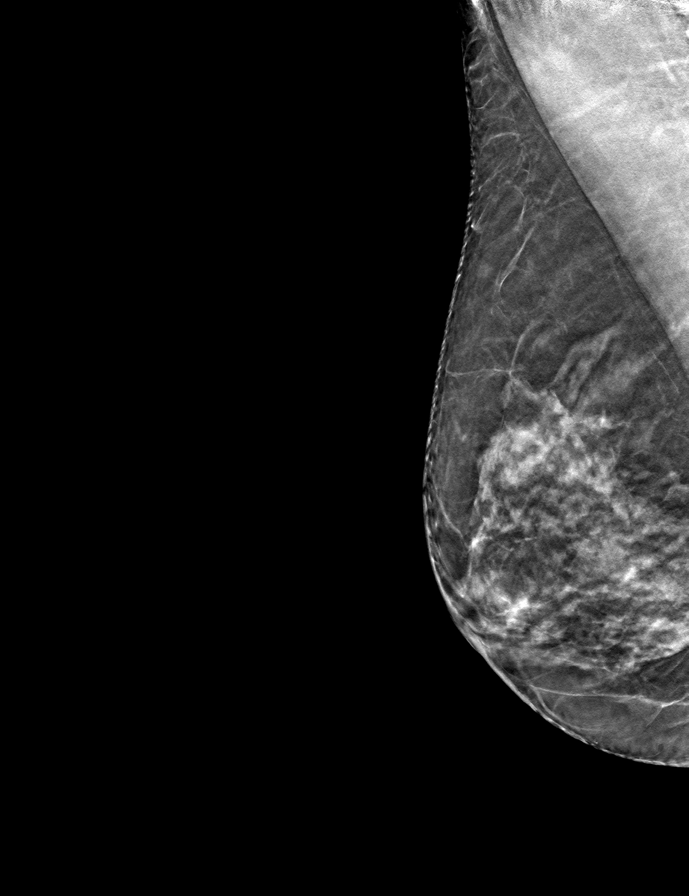

[L MLO tomo · tomo slice 25/48.0]
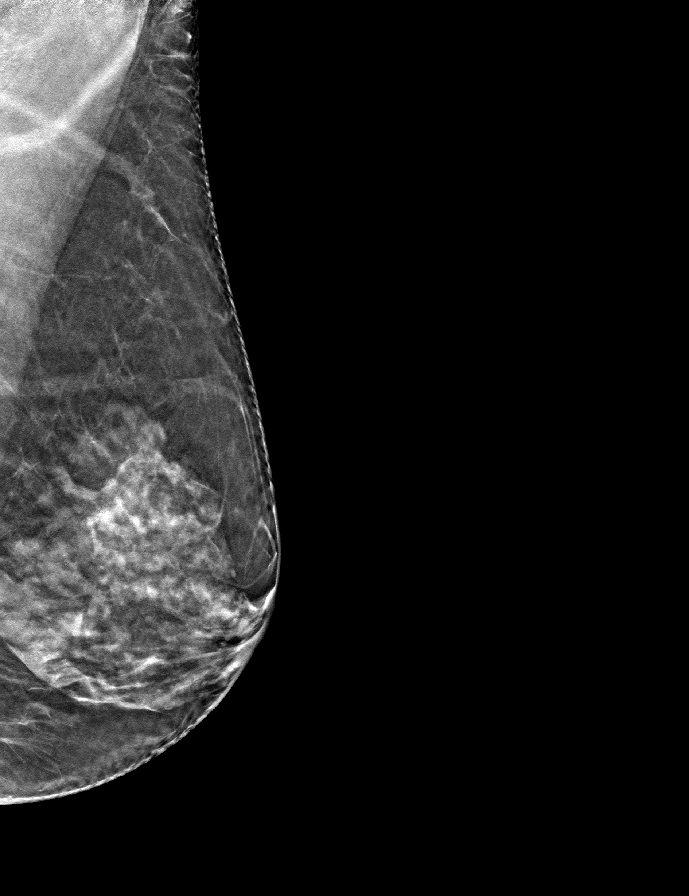

[R CC tomo · tomo slice 28/55.0]
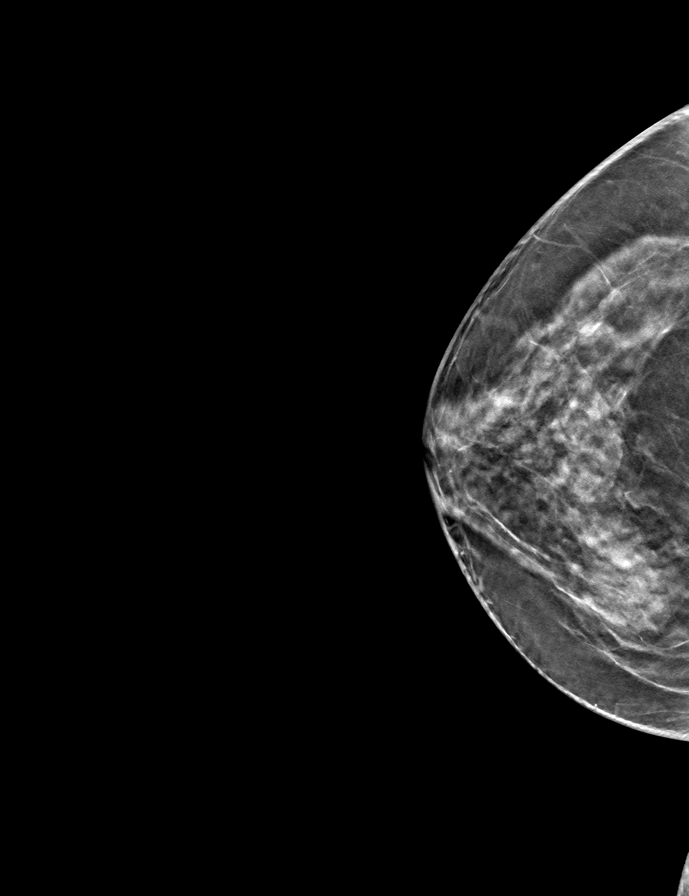

[L CC tomo · tomo slice 27/52.0]
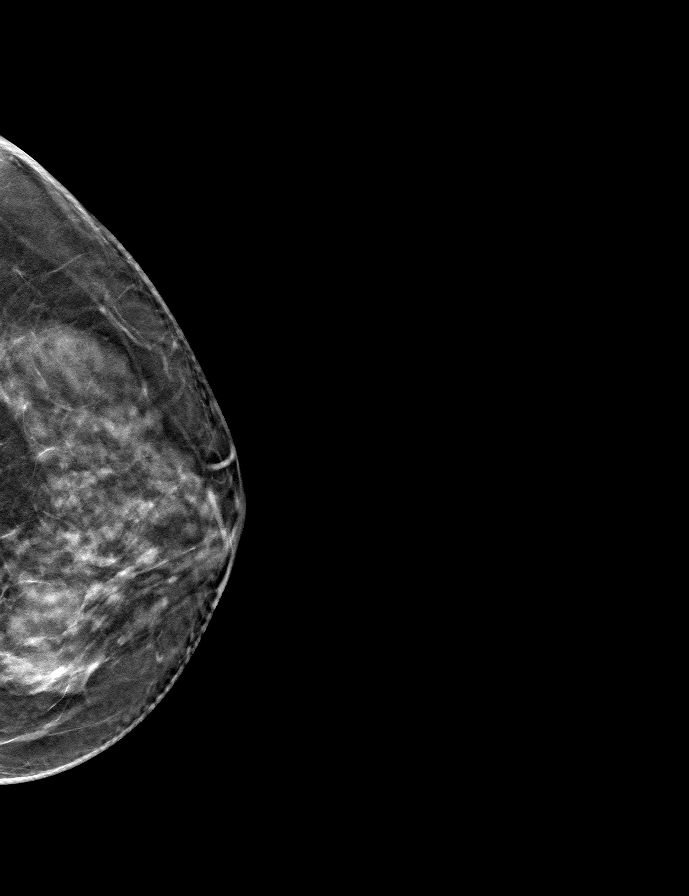

[9 of 24 positions shown; findings below may reference images not displayed]

ACR Breast Density Category c: The breast tissue is heterogeneously
dense, which may obscure small masses.
FINDINGS: There are no findings suspicious for malignancy.
IMPRESSION: No mammographic evidence of malignancy. A result letter of this
screening mammogram will be mailed directly to the patient.

RECOMMENDATION:
Screening mammogram in one year. (Code:Q3-W-BC3)

BI-RADS CATEGORY  1: Negative.

## 2023-04-10 NOTE — Progress Notes (Signed)
treated
